# Patient Record
Sex: Male | Born: 2000 | Race: Black or African American | Hispanic: No | Marital: Single | Smoking: Never smoker
Health system: Southern US, Community
[De-identification: ages and names within clinical notes are randomized; demographics above are authoritative.]

## PROBLEM LIST (undated history)

## (undated) DIAGNOSIS — J45909 Unspecified asthma, uncomplicated: Secondary | ICD-10-CM

---
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
## 2019-11-15 ENCOUNTER — Emergency Department
Admission: EM | Admit: 2019-11-15 | Discharge: 2019-11-15 | Disposition: A | Discharge status: Home / Self Care | Payer: Medicaid Other | Attending: Emergency Medicine | Admitting: Emergency Medicine

## 2019-11-15 ENCOUNTER — Other Ambulatory Visit: Payer: Self-pay

## 2019-11-15 ENCOUNTER — Encounter: Payer: Self-pay | Admitting: Emergency Medicine

## 2019-11-15 DIAGNOSIS — R42 Dizziness and giddiness: Secondary | ICD-10-CM

## 2019-11-15 DIAGNOSIS — T675XXA Heat exhaustion, unspecified, initial encounter: Secondary | ICD-10-CM | POA: Insufficient documentation

## 2019-11-15 LAB — CBC
HCT: 43.7 % (ref 39.0–52.0)
Hemoglobin: 13.5 g/dL (ref 13.0–17.0)
MCH: 25.1 pg — ABNORMAL LOW (ref 26.0–34.0)
MCHC: 30.9 g/dL (ref 30.0–36.0)
MCV: 81.2 fL (ref 80.0–100.0)
Platelets: 297 10*3/uL (ref 150–400)
RBC: 5.38 MIL/uL (ref 4.22–5.81)
RDW: 13.8 % (ref 11.5–15.5)
WBC: 7.8 10*3/uL (ref 4.0–10.5)
nRBC: 0 % (ref 0.0–0.2)

## 2019-11-15 LAB — URINALYSIS, COMPLETE (UACMP) WITH MICROSCOPIC
Bacteria, UA: NONE SEEN
Bilirubin Urine: NEGATIVE
Glucose, UA: NEGATIVE mg/dL
Hgb urine dipstick: NEGATIVE
Ketones, ur: NEGATIVE mg/dL
Leukocytes,Ua: NEGATIVE
Nitrite: NEGATIVE
Protein, ur: NEGATIVE mg/dL
Specific Gravity, Urine: 1.027 (ref 1.005–1.030)
pH: 5 (ref 5.0–8.0)

## 2019-11-15 LAB — BASIC METABOLIC PANEL
Anion gap: 8 (ref 5–15)
BUN: 12 mg/dL (ref 6–20)
CO2: 27 mmol/L (ref 22–32)
Calcium: 8.9 mg/dL (ref 8.9–10.3)
Chloride: 102 mmol/L (ref 98–111)
Creatinine, Ser: 1.07 mg/dL (ref 0.61–1.24)
GFR calc Af Amer: >60 mL/min (ref >60)
GFR calc non Af Amer: >60 mL/min (ref >60)
Glucose, Bld: 97 mg/dL (ref 70–99)
Potassium: 4.2 mmol/L (ref 3.5–5.1)
Sodium: 137 mmol/L (ref 135–145)

## 2019-11-15 MED ORDER — SODIUM CHLORIDE 0.9 % IV BOLUS
1000.0000 mL | Freq: Once | INTRAVENOUS | Status: AC
  Administered 2019-11-15: 1000 mL via INTRAVENOUS

## 2019-11-15 NOTE — Discharge Instructions (Signed)
Follow-up with your primary care provider if any continued problems.  You may also follow-up with [facility] about the lesion on your tongue for further evaluation and possible removal.  Increase fluids.  You also need to increase fluids that contain electrolytes such as Gatorade, Powerade.  You should be drinking between 70 and 80 ounces of fluids by working and for the entire day.  2 bottles of water will not keep you hydrated.

## 2019-11-15 NOTE — ED Notes (Signed)
See triage note  States he became dizzy and felt like he was going to pass out at work today  States the work area was extremely hot  States he has had 2 bottles of water

## 2019-11-15 NOTE — ED Provider Notes (Signed)
[facility] Emergency Department Provider Note  ____________________________________________   First MD Initiated Contact with Patient 11/15/19 1349     (approximate)  I have reviewed the triage vital signs and the nursing notes.   HISTORY  Chief Complaint Dehydration and Dizziness   HPI Jason Barr is a 19 y.o. male presents to the ED with complaint of dizziness and feeling like he is going to "pass out".  Patient states that he works in a [employer] with no [facility] but he does have fans.  Patient did not have a syncopal episode.  He was ambulatory to triage.  He denies any fever, chills, nausea or vomiting.  He states that today he drank 2 bottles of water each containing possibly 8 ounces.  Patient reports that he does sweat a lot while he is at work.       Past Medical History:  Diagnosis Date  . Asthma     There are no problems to display for this patient.   History reviewed. No pertinent surgical history.  Prior to Admission medications   Not on File    Allergies Motrin [ibuprofen]  History reviewed. No pertinent family history.  Social History Social History   Tobacco Use  . Smoking status: Never Smoker  . Smokeless tobacco: Never Used  Substance Use Topics  . Alcohol use: Yes    Comment: occ  . Drug use: Yes    Types: Marijuana    Review of Systems Constitutional: No fever/chills.  Positive dizziness.  Eyes: No visual changes. ENT: No sore throat.  Negative for ear pain. Cardiovascular: Denies chest pain. Respiratory: Denies shortness of breath.  Negative for cough. Gastrointestinal: No abdominal pain.  No nausea, no vomiting.  No diarrhea.  No constipation. Genitourinary: Negative for dysuria. Musculoskeletal: Negative for muscle aches. Skin: Negative for rash. Neurological: Negative for headaches, focal weakness or numbness. ____________________________________________   PHYSICAL EXAM:  VITAL  SIGNS: ED Triage Vitals  Enc Vitals Group     BP 11/15/19 1319 136/73     Pulse Rate 11/15/19 1319 72     Resp 11/15/19 1319 20     Temp 11/15/19 1319 98.9 F (37.2 C)     Temp Source 11/15/19 1319 Oral     SpO2 11/15/19 1319 97 %     Weight 11/15/19 1316 (!) 373 lb 14.4 oz (169.6 kg)     Height 11/15/19 1316 5\' 10"  (1.778 m)     Head Circumference --      Peak Flow --      Pain Score 11/15/19 1315 0     Pain Loc --      Pain Edu? --      Excl. in [place]? --     Constitutional: Alert and oriented. Well appearing and in no acute distress. Eyes: Conjunctivae are normal. PERRL. EOMI. no nystagmus. Head: Atraumatic. Nose: No congestion/rhinnorhea. Mouth/Throat: Mucous membranes are moist.  Oropharynx non-erythematous.  Left lateral tongue has a flesh-colored peduncular type lesion.  Nontender.  Patient states it has been there "for years". Neck: No stridor.   Hematological/Lymphatic/Immunilogical: No cervical lymphadenopathy. Cardiovascular: Normal rate, regular rhythm. Grossly normal heart sounds.  Good peripheral circulation. Respiratory: Normal respiratory effort.  No retractions. Lungs CTAB. Gastrointestinal: Soft and nontender. No distention.  Bowel sounds are normoactive x4 quadrants. Musculoskeletal: Moves upper and lower extremities without any difficulty.  Normal gait was noted. Neurologic:  Normal speech and language. No gross focal neurologic deficits are appreciated. No gait instability.  Skin:  Skin is warm, dry and intact. No rash noted. Psychiatric: Mood and affect are normal. Speech and behavior are normal.  ____________________________________________   LABS (all labs ordered are listed, but only abnormal results are displayed)  Labs Reviewed  CBC - Abnormal; Notable for the following components:      Result Value   MCH 25.1 (*)    All other components within normal limits  URINALYSIS, COMPLETE (UACMP) WITH MICROSCOPIC - Abnormal; Notable for the following  components:   Color, Urine YELLOW (*)    APPearance CLEAR (*)    All other components within normal limits  BASIC METABOLIC PANEL   ____________________________________________  EKG  Normal sinus rhythm with a ventricular rate of 75. 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  Patient states that he has fans and according to what he is saying he is not drinking enough fluids to stay hydrated.  He states that he often feels dizzy due to the heat.  Patient has continued to eat and drink as normal.  No significant findings on physical exam and lab work was reassuring.  EKG showed normal sinus rhythm.  Patient was given a liter of fluids and felt better prior to discharge.  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  He is encouraged to drink more fluids and also fluids with electrolytes such as Gatorade.  He was told that a rough estimate of fluid intake should be half of his weight in ounces especially if he is sweating.  Patient was ambulatory at the time of discharge.  ____________________________________________   FINAL CLINICAL IMPRESSION(S) / ED DIAGNOSES  Final diagnoses:  Heat exhaustion, initial encounter  Dizziness     ED Discharge Orders    None       Note:  This document was prepared using Dragon voice recognition software and may include unintentional dictation errors.    Tommi Rumps, PA-C 11/15/19 1932    Willy Eddy, MD 11/21/19 [phone]

## 2019-11-15 NOTE — ED Triage Notes (Signed)
Pt here for dizziness and feeling like going to pass out at work. Works in [employer] with no AC but has fans.  ambulatory to triage.  No pain. No illness prior. reports drank 2 bottles water today.  No NVD. Still feels dizzy.  Mom would also like to have pt tongue looked at for growth on side of tongue.

## 2020-03-25 IMAGING — DX LEFT KNEE - COMPLETE 4+ VIEW
4 series · 4 of 4 positions shown · non-contrast
Comparison: None.

CLINICAL DATA: Left knee pain status post fall

EXAM:
LEFT KNEE - COMPLETE 4+ VIEW

[knee ap]
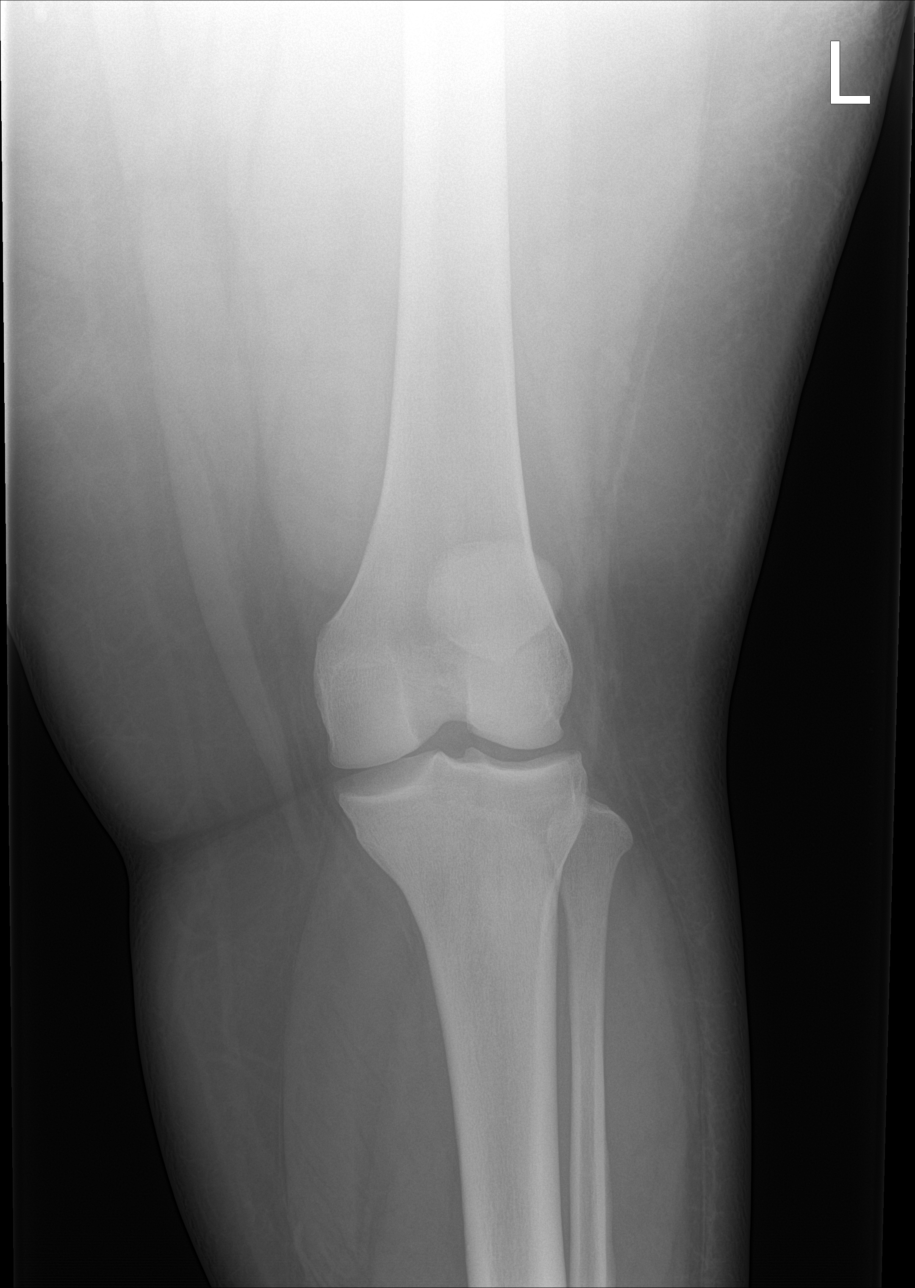

[knee obl (1 of 2)]
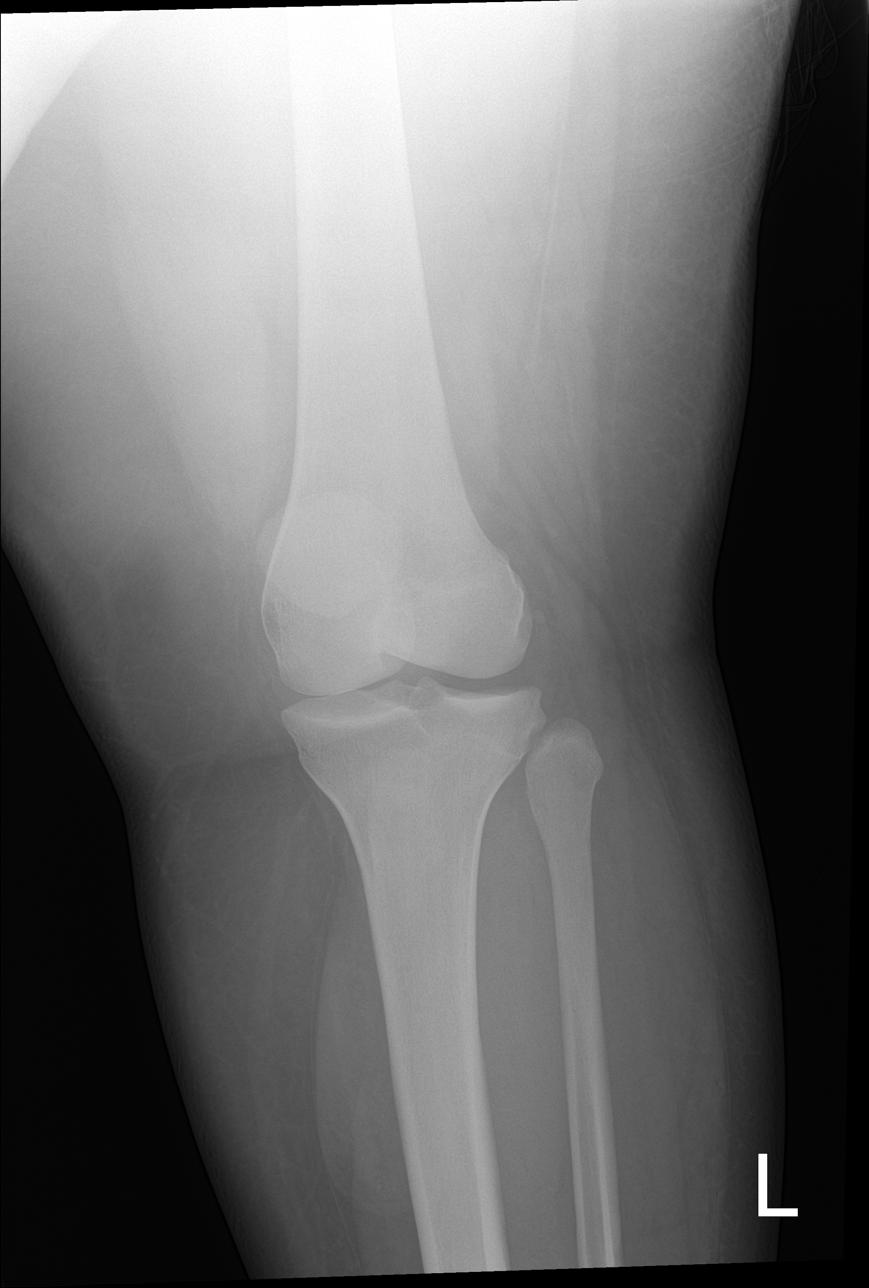

[knee obl (2 of 2)]
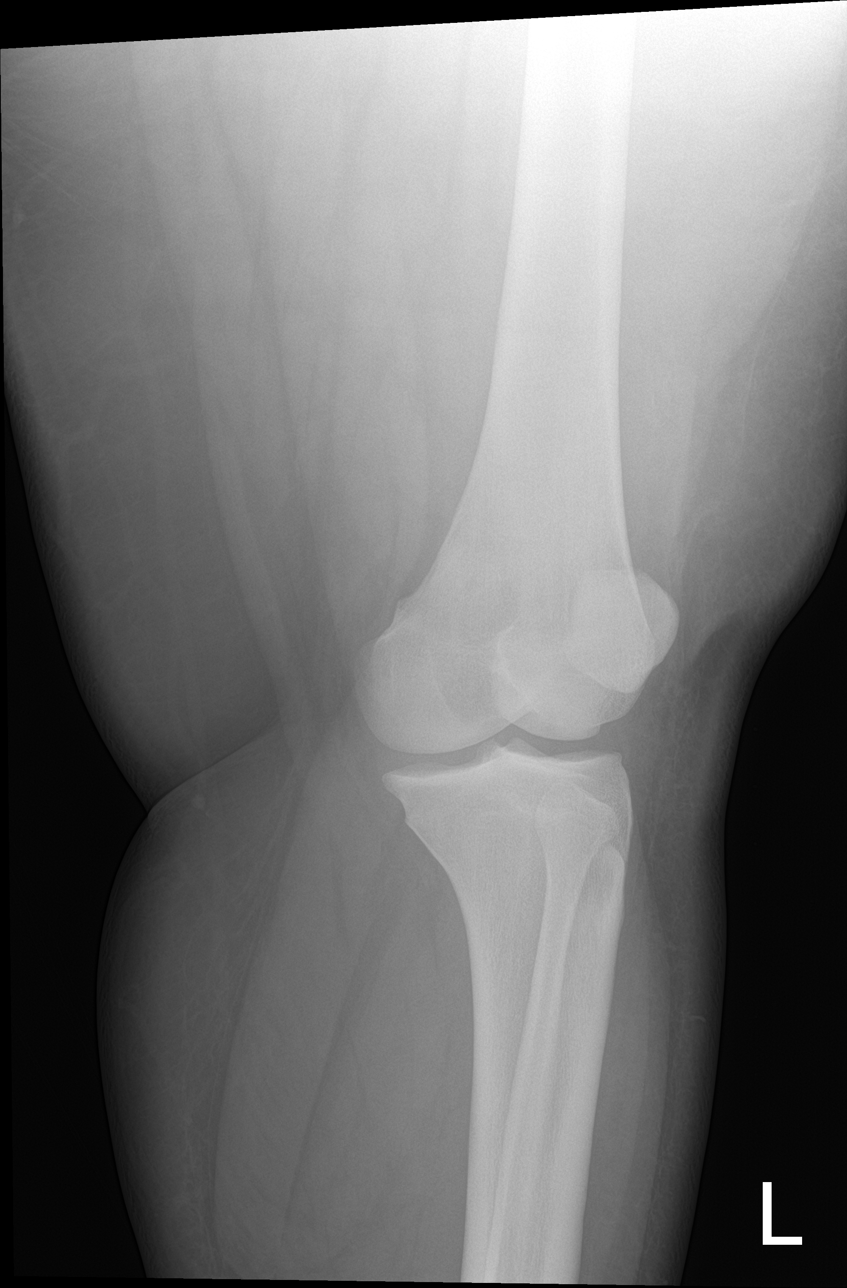

[knee lat]
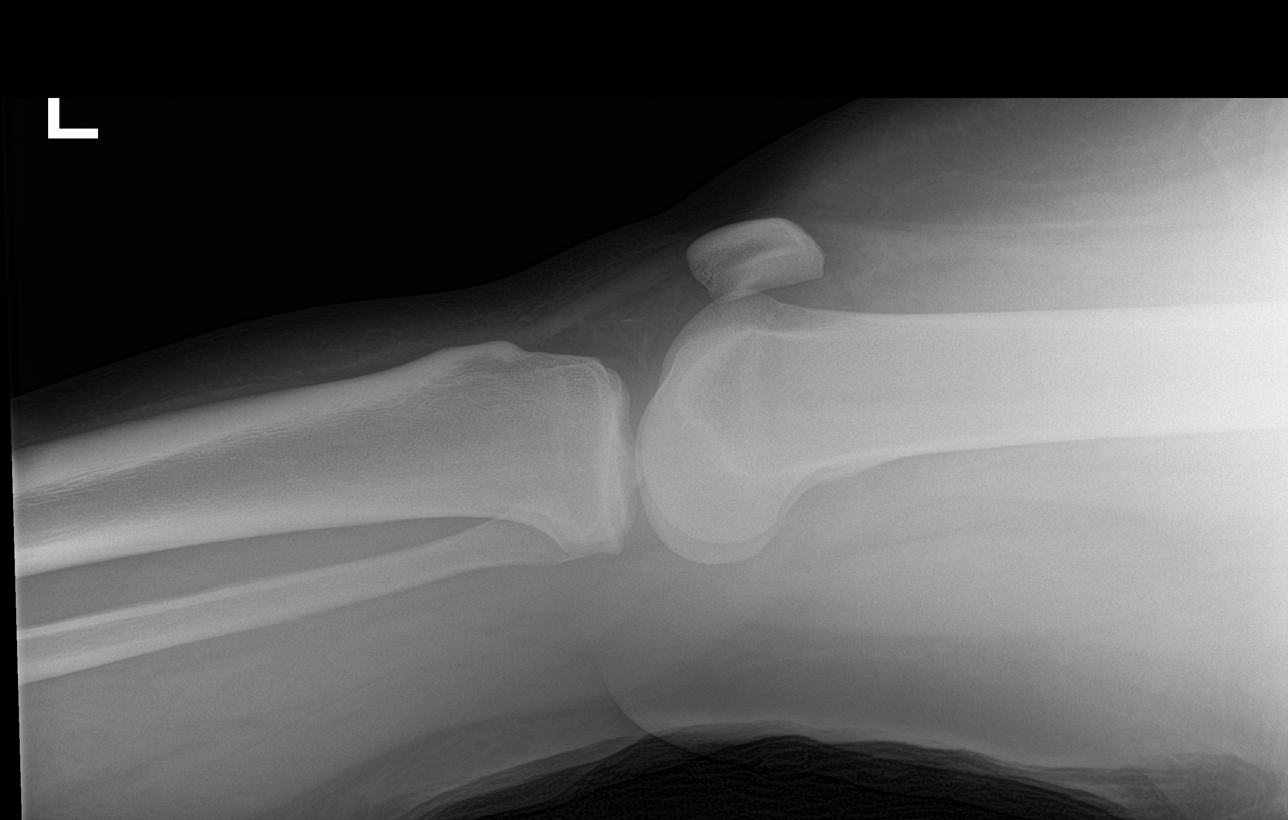

[4 of 4 positions shown; findings below may reference images not displayed]

FINDINGS: There is no acute displaced fracture or dislocation. There is a
probable trace joint effusion. No prepatellar soft tissue swelling.
IMPRESSION: 1. No acute displaced fracture or dislocation.
2. Probable trace joint effusion

## 2023-04-25 ENCOUNTER — Emergency Department
Admission: EM | Admit: 2023-04-25 | Discharge: 2023-04-25 | Disposition: A | Discharge status: Home / Self Care | Payer: Medicaid Other | Attending: Emergency Medicine | Admitting: Emergency Medicine

## 2023-04-25 ENCOUNTER — Other Ambulatory Visit: Payer: Self-pay

## 2023-04-25 DIAGNOSIS — R0981 Nasal congestion: Secondary | ICD-10-CM | POA: Diagnosis present

## 2023-04-25 DIAGNOSIS — J029 Acute pharyngitis, unspecified: Secondary | ICD-10-CM

## 2023-04-25 DIAGNOSIS — Z1152 Encounter for screening for COVID-19: Secondary | ICD-10-CM | POA: Insufficient documentation

## 2023-04-25 DIAGNOSIS — J069 Acute upper respiratory infection, unspecified: Secondary | ICD-10-CM | POA: Diagnosis not present

## 2023-04-25 LAB — SARS CORONAVIRUS 2 BY RT PCR: SARS Coronavirus 2 by RT PCR: NEGATIVE

## 2023-04-25 LAB — GROUP A STREP BY PCR: Group A Strep by PCR: NOT DETECTED

## 2023-04-25 MED ORDER — DEXAMETHASONE SODIUM PHOSPHATE 10 MG/ML IJ SOLN
10.0000 mg | Freq: Once | INTRAMUSCULAR | Status: AC
  Administered 2023-04-25: 10 mg via INTRAMUSCULAR
  Filled 2023-04-25: qty 1

## 2023-04-25 NOTE — Discharge Instructions (Addendum)
Your COVID and strep swabs were negative.  Please follow-up with your outpatient provider.  Please return for any new, worsening, or change in symptoms or other concerns.  It was a pleasure caring for you today.

## 2023-04-25 NOTE — ED Provider Notes (Signed)
[facility] Provider Note    Event Date/Time   First MD Initiated Contact with Patient 04/25/23 1405     (approximate)   History   Sore Throat   HPI  Jason Barr is a 22 y.o. male with a medical history of obesity who presents today for evaluation of nasal congestion and sore throat for the past 3 days.  He denies fevers.  He denies any known sick contacts.  He has pain with swallowing but no difficulty swallowing.  He has not noticed any voice changes.  He is able to eat and drink.  There are no problems to display for this patient.         Physical Exam   Triage Vital Signs: ED Triage Vitals  Encounter Vitals Group     BP 04/25/23 1306 (!) 150/107     Systolic BP Percentile --      Diastolic BP Percentile --      Pulse Rate 04/25/23 1306 97     Resp 04/25/23 1306 14     Temp 04/25/23 1306 97.9 F (36.6 C)     Temp Source 04/25/23 1306 Oral     SpO2 04/25/23 1306 94 %     Weight 04/25/23 1306 (!) 393 lb (178.3 kg)     Height 04/25/23 1306 5\' 10"  (1.778 m)     Head Circumference --      Peak Flow --      Pain Score 04/25/23 1310 10     Pain Loc --      Pain Education --      Exclude from Growth Chart --     Most recent vital signs: Vitals:   04/25/23 1306  BP: (!) 150/107  Pulse: 97  Resp: 14  Temp: 97.9 F (36.6 C)  SpO2: 94%    Physical Exam Vitals and nursing note reviewed.  Constitutional:      General: Awake and alert. No acute distress.    Appearance: Normal appearance. The patient is obese.  HENT:     Head: Normocephalic and atraumatic.     Mouth: Mucous membranes are moist. Uvula midline.  No tonsillar exudate.  No soft palate fluctuance.  No trismus.  No voice change.  No sublingual swelling.  No tender cervical lymphadenopathy.  No nuchal rigidity Nasal congestion present Eyes:     General: PERRL. Normal EOMs        Right eye: No discharge.        Left eye: No discharge.     Conjunctiva/sclera:  Conjunctivae normal.  Cardiovascular:     Rate and Rhythm: Normal rate and regular rhythm.     Pulses: Normal pulses.  Pulmonary:     Effort: Pulmonary effort is normal. No respiratory distress.     Breath sounds: Normal breath sounds.  Abdominal:     Abdomen is soft. There is no abdominal tenderness. No rebound or guarding. No distention. Musculoskeletal:        General: No swelling. Normal range of motion.     Cervical back: Normal range of motion and neck supple.  Skin:    General: Skin is warm and dry.     Capillary Refill: Capillary refill takes less than 2 seconds.     Findings: No rash.  Neurological:     Mental Status: The patient is awake and alert.      ED Results / Procedures / Treatments   Labs (all labs ordered are listed, but only abnormal  results are displayed) Labs Reviewed  SARS CORONAVIRUS 2 BY RT PCR  GROUP A STREP BY PCR     EKG     RADIOLOGY     PROCEDURES:  Critical Care performed:   Procedures   MEDICATIONS ORDERED IN ED: Medications  dexamethasone (DECADRON) injection 10 mg (10 mg Intramuscular Given 04/25/23 1421)     IMPRESSION / MDM / ASSESSMENT AND PLAN / ED COURSE  I reviewed the triage vital signs and the nursing notes.   Differential diagnosis includes, but is not limited to, strep pharyngitis, COVID-19, viral pharyngitis, other viral URI.  Patient is awake and alert, hemodynamically stable and afebrile.  He is nontoxic in appearance.  He has obvious nasal congestion.  His uvula is midline, there is no tonsillar exudate, there is no voice change, trismus, drooling, no difficulty handling secretions, I do not suspect peritonsillar or retropharyngeal abscess.  COVID and strep swabs obtained in triage are negative.  Patient was treated symptomatically with Decadron, reviewed the patient's chart and he has normal blood sugar and no history of diabetes.  We discussed symptomatic management and return precautions.  Patient  understands and agrees with plan.  He was discharged in stable condition.  Patient's presentation is most consistent with acute complicated illness / injury requiring diagnostic workup.    FINAL CLINICAL IMPRESSION(S) / ED DIAGNOSES   Final diagnoses:  Pharyngitis, unspecified etiology  Upper respiratory tract infection, unspecified type     Rx / DC Orders   ED Discharge Orders     None        Note:  This document was prepared using Dragon voice recognition software and may include unintentional dictation errors.   Keturah Shavers 04/25/23 1424    Jene Every, MD 04/25/23 1447

## 2023-04-25 NOTE — ED Triage Notes (Addendum)
Pt to ED via POV from home. Pt reports sore throat, HA and congestion x3 days. Pt denies sick contacts.

## 2023-04-25 NOTE — ED Notes (Signed)
See triage note  Presents with sore throat and headache  States sx's started 3 days ago  Afebrile on arrival

## 2023-04-27 ENCOUNTER — Encounter: Payer: Self-pay | Admitting: Emergency Medicine

## 2023-04-27 ENCOUNTER — Emergency Department
Admission: EM | Admit: 2023-04-27 | Discharge: 2023-04-27 | Disposition: A | Discharge status: Home / Self Care | Payer: Medicaid Other | Attending: Emergency Medicine | Admitting: Emergency Medicine

## 2023-04-27 ENCOUNTER — Other Ambulatory Visit: Payer: Self-pay

## 2023-04-27 ENCOUNTER — Emergency Department: Payer: Medicaid Other

## 2023-04-27 DIAGNOSIS — R070 Pain in throat: Secondary | ICD-10-CM | POA: Diagnosis present

## 2023-04-27 DIAGNOSIS — J029 Acute pharyngitis, unspecified: Secondary | ICD-10-CM | POA: Diagnosis not present

## 2023-04-27 LAB — GROUP A STREP BY PCR: Group A Strep by PCR: NOT DETECTED

## 2023-04-27 MED ORDER — PREDNISONE 10 MG (21) PO TBPK: ORAL_TABLET | ORAL | 0 refills | Status: AC

## 2023-04-27 MED ORDER — LIDOCAINE VISCOUS HCL 2 % MT SOLN: 15.0000 mL | OROMUCOSAL | 0 refills | Status: AC | PRN

## 2023-04-27 MED ORDER — LIDOCAINE VISCOUS HCL 2 % MT SOLN
15.0000 mL | Freq: Once | OROMUCOSAL | Status: AC
  Administered 2023-04-27: 15 mL via OROMUCOSAL
  Filled 2023-04-27: qty 15

## 2023-04-27 NOTE — Discharge Instructions (Signed)
Please speak to your doctor as well about obtaining a sleep study.

## 2023-04-27 NOTE — ED Provider Notes (Signed)
[facility] Provider Note    Event Date/Time   First MD Initiated Contact with Patient 04/27/23 0701     (approximate)   History   Sore throat   HPI  Jason Barr is a 22 y.o. male  who presents to the emergency department today because of concern for continued and worsening throat pain. Was seen in the ER 2 days ago for similar symptoms. Tested negative for COVID and strep at that time. Was given a shot of steroids which did help temporarily. Has been trying OTC cough and flu medication as well as antibiotics his mother had left over from a hospitalization. Describes the pain as the feeling of swallowing glass. Denies any fevers.       Physical Exam   Triage Vital Signs: ED Triage Vitals  Encounter Vitals Group     BP 04/27/23 0633 (!) 147/106     Systolic BP Percentile --      Diastolic BP Percentile --      Pulse Rate 04/27/23 0633 100     Resp 04/27/23 0633 (!) 24     Temp 04/27/23 0633 97.9 F (36.6 C)     Temp Source 04/27/23 0633 Oral     SpO2 04/27/23 0633 90 %     Weight 04/27/23 0634 (!) 392 lb 6.7 oz (178 kg)     Height 04/27/23 0634 5\' 10"  (1.778 m)     Head Circumference --      Peak Flow --      Pain Score 04/27/23 0634 0     Pain Loc --      Pain Education --      Exclude from Growth Chart --     Most recent vital signs: Vitals:   04/27/23 0633 04/27/23 0657  BP: (!) 147/106   Pulse: 100 96  Resp: (!) 24   Temp: 97.9 F (36.6 C)   SpO2: 90% 100%   General: Awake, alert, oriented. CV:  Good peripheral perfusion. Regular rate and rhythm. Resp:  Normal effort. Lungs clear. Abd:  No distention.  Other:  Lesions and erythema noted to bilateral tonsils. Uvula midline.    ED Results / Procedures / Treatments   Labs (all labs ordered are listed, but only abnormal results are displayed) Labs Reviewed  GROUP A STREP BY PCR     EKG  None   RADIOLOGY I independently interpreted and visualized the CXR.  My interpretation: No pneumonia Radiology interpretation:  IMPRESSION:  No active disease.      PROCEDURES:  Critical Care performed: No    MEDICATIONS ORDERED IN ED: Medications - No data to display   IMPRESSION / MDM / ASSESSMENT AND PLAN / ED COURSE  I reviewed the triage vital signs and the nursing notes.                              Differential diagnosis includes, but is not limited to, strep, viral pharyngitis  Patient's presentation is most consistent with acute presentation with potential threat to life or bodily function.  Patient presented to the emergency department today because of concern for worsening sore throat. On exam patient does have erythema and lesions to tonsils. Will try viscous lidocaine for pain relief. Will recheck strep.  Strep negative. Will plan on discharging with prescription for steroids and viscous lidocaine. While here in the emergency department the patient did fall asleep and it was noted  he had some desaturation of his oxygen. I discussed this with the patient. He says this is a known problem and he has been told he would need a sleep study. I did encourage the patient to discuss this with his doctor.   FINAL CLINICAL IMPRESSION(S) / ED DIAGNOSES   Final diagnoses:  Pharyngitis, unspecified etiology     Note:  This document was prepared using Dragon voice recognition software and may include unintentional dictation errors.    Phineas Semen, MD 04/27/23 [phone]

## 2023-04-27 NOTE — ED Triage Notes (Signed)
Patient ambulatory to triage with complaints of worsening sore throat and shortness of breath, States he was seen here a couple of days ago and had negative COVID and strep teset but feels he's only gotten worse. States it is hard to swallow now and he feels an increased work of breathing.
